# Patient Record
Sex: Male | Born: 1985 | Race: Black or African American | Hispanic: No
Health system: Southern US, Community
[De-identification: ages and names within clinical notes are randomized; demographics above are authoritative.]

---

## 2018-07-24 ENCOUNTER — Emergency Department (HOSPITAL_COMMUNITY): Payer: PRIVATE HEALTH INSURANCE

## 2018-07-24 ENCOUNTER — Emergency Department (HOSPITAL_COMMUNITY)
Admission: EM | Admit: 2018-07-24 | Discharge: 2018-07-24 | Disposition: A | Discharge status: Home / Self Care | Payer: PRIVATE HEALTH INSURANCE | Attending: Emergency Medicine | Admitting: Emergency Medicine

## 2018-07-24 DIAGNOSIS — S27301A Unspecified injury of lung, unilateral, initial encounter: Secondary | ICD-10-CM | POA: Diagnosis present

## 2018-07-24 DIAGNOSIS — M79642 Pain in left hand: Secondary | ICD-10-CM | POA: Diagnosis not present

## 2018-07-24 DIAGNOSIS — Y92410 Unspecified street and highway as the place of occurrence of the external cause: Secondary | ICD-10-CM | POA: Insufficient documentation

## 2018-07-24 DIAGNOSIS — S27321A Contusion of lung, unilateral, initial encounter: Secondary | ICD-10-CM | POA: Insufficient documentation

## 2018-07-24 DIAGNOSIS — Y998 Other external cause status: Secondary | ICD-10-CM | POA: Insufficient documentation

## 2018-07-24 DIAGNOSIS — Y9389 Activity, other specified: Secondary | ICD-10-CM | POA: Diagnosis not present

## 2018-07-24 LAB — I-STAT CHEM 8, ED
BUN: 10 mg/dL (ref 6–20)
Calcium, Ion: 1.16 mmol/L (ref 1.15–1.40)
Chloride: 101 mmol/L (ref 98–111)
Creatinine, Ser: 1 mg/dL (ref 0.61–1.24)
Glucose, Bld: 88 mg/dL (ref 70–99)
HCT: 46 % (ref 39.0–52.0)
Hemoglobin: 15.6 g/dL (ref 13.0–17.0)
Potassium: 3.7 mmol/L (ref 3.5–5.1)
Sodium: 138 mmol/L (ref 135–145)
TCO2: 29 mmol/L (ref 22–32)

## 2018-07-24 MED ORDER — HYDROCODONE-ACETAMINOPHEN 5-325 MG PO TABS: 1.0000 | ORAL_TABLET | ORAL | 0 refills | Status: AC | PRN

## 2018-07-24 MED ORDER — ACETAMINOPHEN 500 MG PO TABS
1000.0000 mg | ORAL_TABLET | Freq: Once | ORAL | Status: AC
  Administered 2018-07-24: 1000 mg via ORAL
  Filled 2018-07-24: qty 2

## 2018-07-24 MED ORDER — IOHEXOL 300 MG/ML  SOLN
100.0000 mL | Freq: Once | INTRAMUSCULAR | Status: AC | PRN
  Administered 2018-07-24: 100 mL via INTRAVENOUS

## 2018-07-24 NOTE — ED Notes (Signed)
Patient transported to X-ray 
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
## 2018-07-24 NOTE — ED Notes (Signed)
Patient's family, Rosita Kea, would like update: [phone]

## 2018-07-24 NOTE — ED Notes (Signed)
Pt refusing IV 
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
## 2018-07-24 NOTE — Discharge Instructions (Signed)
Take pain medicine as directed Make sure you are taking deep breaths, cough up any phlegm, and use incentive spirometer every 1-2 hours while awake to prevent infection Please return if you have any worsening symptoms such as fever, worsening chest pain, coughing, shortness of breath
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
